# Patient Record
Sex: Male | Born: 1990 | Race: Black or African American | Hispanic: No | Marital: Single | Smoking: Current every day smoker
Health system: Southern US, Community
[De-identification: ages and names within clinical notes are randomized; demographics above are authoritative.]

## PROBLEM LIST (undated history)

## (undated) DIAGNOSIS — F329 Major depressive disorder, single episode, unspecified: Secondary | ICD-10-CM

## (undated) DIAGNOSIS — F203 Undifferentiated schizophrenia: Secondary | ICD-10-CM

## (undated) DIAGNOSIS — F32A Depression, unspecified: Secondary | ICD-10-CM

## (undated) DIAGNOSIS — F319 Bipolar disorder, unspecified: Secondary | ICD-10-CM

## (undated) HISTORY — PX: FRACTURE SURGERY: SHX138

---
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
## 2015-05-23 ENCOUNTER — Emergency Department (HOSPITAL_COMMUNITY)
Admission: EM | Admit: 2015-05-23 | Discharge: 2015-05-24 | Disposition: A | Discharge status: Other Acute Inpatient Hospital | Payer: Self-pay | Attending: Emergency Medicine | Admitting: Emergency Medicine

## 2015-05-23 ENCOUNTER — Emergency Department (HOSPITAL_COMMUNITY): Payer: Self-pay

## 2015-05-23 ENCOUNTER — Encounter (HOSPITAL_COMMUNITY): Payer: Self-pay | Admitting: Oncology

## 2015-05-23 DIAGNOSIS — X781XXA Intentional self-harm by knife, initial encounter: Secondary | ICD-10-CM | POA: Insufficient documentation

## 2015-05-23 DIAGNOSIS — Y998 Other external cause status: Secondary | ICD-10-CM | POA: Insufficient documentation

## 2015-05-23 DIAGNOSIS — Z8659 Personal history of other mental and behavioral disorders: Secondary | ICD-10-CM | POA: Insufficient documentation

## 2015-05-23 DIAGNOSIS — S51812A Laceration without foreign body of left forearm, initial encounter: Secondary | ICD-10-CM | POA: Insufficient documentation

## 2015-05-23 DIAGNOSIS — M79641 Pain in right hand: Secondary | ICD-10-CM

## 2015-05-23 DIAGNOSIS — Z23 Encounter for immunization: Secondary | ICD-10-CM | POA: Insufficient documentation

## 2015-05-23 DIAGNOSIS — Y9289 Other specified places as the place of occurrence of the external cause: Secondary | ICD-10-CM | POA: Insufficient documentation

## 2015-05-23 DIAGNOSIS — F17200 Nicotine dependence, unspecified, uncomplicated: Secondary | ICD-10-CM | POA: Insufficient documentation

## 2015-05-23 DIAGNOSIS — Z880 Allergy status to penicillin: Secondary | ICD-10-CM | POA: Insufficient documentation

## 2015-05-23 DIAGNOSIS — R45851 Suicidal ideations: Secondary | ICD-10-CM

## 2015-05-23 DIAGNOSIS — F329 Major depressive disorder, single episode, unspecified: Secondary | ICD-10-CM

## 2015-05-23 DIAGNOSIS — Y9389 Activity, other specified: Secondary | ICD-10-CM | POA: Insufficient documentation

## 2015-05-23 HISTORY — DX: Bipolar disorder, unspecified: F31.9

## 2015-05-23 HISTORY — DX: Undifferentiated schizophrenia: F20.3

## 2015-05-23 HISTORY — DX: Depression, unspecified: F32.A

## 2015-05-23 HISTORY — DX: Major depressive disorder, single episode, unspecified: F32.9

## 2015-05-23 LAB — COMPREHENSIVE METABOLIC PANEL
ALBUMIN: 3.8 g/dL (ref 3.5–5.0)
ALK PHOS: 49 U/L (ref 38–126)
ALT: 36 U/L (ref 17–63)
ANION GAP: 7 (ref 5–15)
AST: 27 U/L (ref 15–41)
BUN: 12 mg/dL (ref 6–20)
CALCIUM: 9.3 mg/dL (ref 8.9–10.3)
CO2: 28 mmol/L (ref 22–32)
Chloride: 107 mmol/L (ref 101–111)
Creatinine, Ser: 1.07 mg/dL (ref 0.61–1.24)
GFR calc Af Amer: >60 mL/min (ref >60)
GFR calc non Af Amer: >60 mL/min (ref >60)
GLUCOSE: 112 mg/dL — AB (ref 65–99)
Potassium: 3.4 mmol/L — ABNORMAL LOW (ref 3.5–5.1)
Sodium: 142 mmol/L (ref 135–145)
Total Bilirubin: 0.7 mg/dL (ref 0.3–1.2)
Total Protein: 6.8 g/dL (ref 6.5–8.1)

## 2015-05-23 LAB — SALICYLATE LEVEL: Salicylate Lvl: <4 mg/dL (ref 2.8–30.0)

## 2015-05-23 LAB — ETHANOL: Alcohol, Ethyl (B): <5 mg/dL (ref <5)

## 2015-05-23 LAB — CBC
HEMATOCRIT: 37.3 % — AB (ref 39.0–52.0)
HEMOGLOBIN: 13.3 g/dL (ref 13.0–17.0)
MCH: 29.7 pg (ref 26.0–34.0)
MCHC: 35.7 g/dL (ref 30.0–36.0)
MCV: 83.3 fL (ref 78.0–100.0)
PLATELETS: 294 10*3/uL (ref 150–400)
RBC: 4.48 MIL/uL (ref 4.22–5.81)
RDW: 12.7 % (ref 11.5–15.5)
WBC: 13.6 10*3/uL — ABNORMAL HIGH (ref 4.0–10.5)

## 2015-05-23 LAB — RAPID URINE DRUG SCREEN, HOSP PERFORMED
Amphetamines: NOT DETECTED
BARBITURATES: NOT DETECTED
Benzodiazepines: NOT DETECTED
COCAINE: NOT DETECTED
Opiates: NOT DETECTED
Tetrahydrocannabinol: NOT DETECTED

## 2015-05-23 LAB — ACETAMINOPHEN LEVEL

## 2015-05-23 MED ORDER — LORAZEPAM 1 MG PO TABS
1.0000 mg | ORAL_TABLET | Freq: Three times a day (TID) | ORAL | Status: DC | PRN
  Administered 2015-05-23 (×2): 1 mg via ORAL
  Filled 2015-05-23 (×2): qty 1

## 2015-05-23 MED ORDER — TETANUS-DIPHTH-ACELL PERTUSSIS 5-2.5-18.5 LF-MCG/0.5 IM SUSP
0.5000 mL | Freq: Once | INTRAMUSCULAR | Status: AC
  Administered 2015-05-23: 0.5 mL via INTRAMUSCULAR
  Filled 2015-05-23: qty 0.5

## 2015-05-23 MED ORDER — NICOTINE 21 MG/24HR TD PT24
21.0000 mg | MEDICATED_PATCH | Freq: Every day | TRANSDERMAL | Status: DC
  Filled 2015-05-23: qty 1

## 2015-05-23 MED ORDER — ACETAMINOPHEN 325 MG PO TABS
650.0000 mg | ORAL_TABLET | ORAL | Status: DC | PRN
  Administered 2015-05-23: 650 mg via ORAL
  Filled 2015-05-23: qty 2

## 2015-05-23 MED ORDER — LIDOCAINE HCL (PF) 1 % IJ SOLN
5.0000 mL | Freq: Once | INTRAMUSCULAR | Status: AC
  Administered 2015-05-23: 5 mL
  Filled 2015-05-23: qty 5

## 2015-05-23 MED ORDER — ONDANSETRON HCL 4 MG PO TABS: 4.0000 mg | ORAL_TABLET | Freq: Three times a day (TID) | ORAL | Status: DC | PRN

## 2015-05-23 MED ORDER — IBUPROFEN 200 MG PO TABS
600.0000 mg | ORAL_TABLET | Freq: Three times a day (TID) | ORAL | Status: DC | PRN
  Administered 2015-05-23 (×2): 600 mg via ORAL
  Filled 2015-05-23 (×2): qty 3

## 2015-05-23 MED ORDER — POTASSIUM CHLORIDE CRYS ER 20 MEQ PO TBCR
40.0000 meq | EXTENDED_RELEASE_TABLET | Freq: Once | ORAL | Status: AC
  Administered 2015-05-23: 40 meq via ORAL
  Filled 2015-05-23: qty 2

## 2015-05-23 MED ORDER — ALUM & MAG HYDROXIDE-SIMETH 200-200-20 MG/5ML PO SUSP: 30.0000 mL | ORAL | Status: DC | PRN

## 2015-05-23 NOTE — BH Assessment (Addendum)
Assessment Note  Henry Mathis is an 25 y.o. male presenting to [facility] for lacerations to his arm. Patient states that he cut himself last night due to an argument with his girlfriend and "I have another baby and that baby is not hers." Patient denies SI with no intent or plan. Patient states that he has had an unknown amount of suicide attempts in the past with the last one being in 2013. Patient reports that he has a history of cutting himself to "relieve stress" but it has "been a while" before last night. Patient denies HI with no intent or plan. Patient denies telling the EDP that he wanted to kill someone stating that he said "she asked if i ever thought about hurting people, and I do when I'm mad, but i wouldn't kill anyone."  Patient denies history of abuse towards others. Patient denies AVH and does not appear to be responding to internal stimuli. Patient contracts for safety and reports "I can go home, but honestly, I want some medication to keep me mellow, so I ain't got to smoke weed or nothing, that just keeps me calm." Patient then reports that even if he was prescribed medication "I don't got medicaid or nothing so I don't know how I can take it." Patient was directed to [facility] and reports "I don't want to go there." Patient denies use of drugs or alcohol and no UDS or BAL is clear at this time.    Patient was alert and oriented and calm and cooperative. Patient was under a blanket in a triage room and was assessed alone. Patient denies history of trauma/abuse. Patient denies history of aggression. Patient denies access to firearms/weapons. Patient received 2 in one laceration and 2 in the other for a total of 4 stitches due to the lacerations in his arm.   Consulted with Nanine Means, PMH-NP who recommends treatment in the observation unit at this time.  Diagnosis: Bipolar Disorder  Past Medical History:  Past Medical History  Diagnosis Date  . Schizophrenia, acute undifferentiated  (HCC)   . Bipolar 1 disorder (HCC)   . Depression     Past Surgical History  Procedure Laterality Date  . Fracture surgery      left hand    Family History: No family history on file.  Social History:  reports that he has been smoking.  He has never used smokeless tobacco. He reports that he drinks alcohol. He reports that he does not use illicit drugs.  Additional Social History:  Alcohol / Drug Use Pain Medications: See PTA Prescriptions: See PTA Over the Counter: See PTA History of alcohol / drug use?: No history of alcohol / drug abuse  CIWA: CIWA-Ar BP: 109/65 mmHg Pulse Rate: 63 COWS:    Allergies:  Allergies  Allergen Reactions  . Penicillins Other (See Comments)    He does not know    Home Medications:  (Not in a hospital admission)  OB/GYN Status:  No LMP for male patient.  General Assessment Data Location of Assessment: [facility] TTS Assessment: In system Is this a Tele or Face-to-Face Assessment?: Face-to-Face Is this an Initial Assessment or a Re-assessment for this encounter?: Initial Assessment Marital status: Single Is patient pregnant?: No Pregnancy Status: No Living Arrangements: Spouse/significant other, Children, Parent Can pt return to current living arrangement?: Yes Admission Status: Voluntary Is patient capable of signing voluntary admission?: Yes Referral Source: Self/Family/Friend     Crisis Care Plan Living Arrangements: Spouse/significant other, Children, Parent Name of  Psychiatrist: None Name of Therapist: None  Education Status Is patient currently in school?: No Highest grade of school patient has completed: 12th  Risk to self with the past 6 months Suicidal Ideation: No Has patient been a risk to self within the past 6 months prior to admission? : Yes Suicidal Intent: No Has patient had any suicidal intent within the past 6 months prior to admission? : Yes Is patient at risk for suicide?: No Suicidal Plan?: No Has  patient had any suicidal plan within the past 6 months prior to admission? : Other (comment) ("I was going to light myself on fire and I put oil on me") Access to Means: No What has been your use of drugs/alcohol within the last 12 months?: Denies Previous Attempts/Gestures: Yes How many times?Marland Kitchen  Otho Bellows) Other Self Harm Risks: Cutting Triggers for Past Attempts: Other (Comment) ("stress") Intentional Self Injurious Behavior: Cutting Comment - Self Injurious Behavior: cutting - to relieve stress (last time last night) Family Suicide History: No Recent stressful life event(s): Other (Comment) ("my baby") Persecutory voices/beliefs?: No Depression: Yes Depression Symptoms: Insomnia, Loss of interest in usual pleasures Substance abuse history and/or treatment for substance abuse?: No Suicide prevention information given to non-admitted patients: Not applicable  Risk to Others within the past 6 months Homicidal Ideation: No Does patient have any lifetime risk of violence toward others beyond the six months prior to admission? : No Thoughts of Harm to Others: No Current Homicidal Intent: No Current Homicidal Plan: No Access to Homicidal Means: No Identified Victim: Denies History of harm to others?: No Assessment of Violence: None Noted Violent Behavior Description: Denies Does patient have access to weapons?: No Criminal Charges Pending?: Yes Describe Pending Criminal Charges: Assault Does patient have a court date: Yes Court Date:  Otho Bellows) Is patient on probation?: Yes  Psychosis Hallucinations: None noted Delusions: None noted  Mental Status Report Appearance/Hygiene: In scrubs Eye Contact: Poor Motor Activity: Unable to assess Speech: Logical/coherent Level of Consciousness: Alert Mood: Pleasant Affect: Appropriate to circumstance Anxiety Level: None Thought Processes: Coherent, Relevant Judgement: Unimpaired Orientation: Person, Place, Time, Situation, Appropriate for  developmental age Obsessive Compulsive Thoughts/Behaviors: None  Cognitive Functioning Concentration: Normal Memory: Recent Intact, Remote Intact IQ: Average Insight: Fair Impulse Control: Fair Appetite: Fair Sleep: Decreased Total Hours of Sleep: 5 Vegetative Symptoms: None  ADLScreening [facility] Assessment Services) Patient's cognitive ability adequate to safely complete daily activities?: Yes Patient able to express need for assistance with ADLs?: Yes Independently performs ADLs?: Yes (appropriate for developmental age)  Prior Inpatient Therapy Prior Inpatient Therapy: Yes Prior Therapy Dates: 2013 Prior Therapy Facilty/Provider(s): [facility] Reason for Treatment: SI  Prior Outpatient Therapy Prior Outpatient Therapy: Yes Prior Therapy Dates: 2013 Prior Therapy Facilty/Provider(s): [facility] Reason for Treatment: Depression Does patient have an ACCT team?: No Does patient have Intensive In-House Services?  : No Does patient have [facility] services? : No Does patient have P4CC services?: No  ADL Screening (condition at time of admission) Patient's cognitive ability adequate to safely complete daily activities?: Yes Is the patient deaf or have difficulty hearing?: No Does the patient have difficulty seeing, even when wearing glasses/contacts?: No Does the patient have difficulty concentrating, remembering, or making decisions?: No Patient able to express need for assistance with ADLs?: Yes Does the patient have difficulty dressing or bathing?: No Independently performs ADLs?: Yes (appropriate for developmental age) Does the patient have difficulty walking or climbing stairs?: No Weakness of Legs: None Weakness of Arms/Hands: None  Home Assistive  Devices/Equipment Home Assistive Devices/Equipment: None    Abuse/Neglect Assessment (Assessment to be complete while patient is alone) Physical Abuse: Denies Verbal Abuse: Denies Sexual Abuse: Denies Exploitation of  patient/patient's resources: Denies Self-Neglect: Denies Values / Beliefs Cultural Requests During Hospitalization: None Spiritual Requests During Hospitalization: None Consults Spiritual Care Consult Needed: No Social Work Consult Needed: No Merchant navy officerAdvance Directives (For Healthcare) Does patient have an advance directive?: No Would patient like information on creating an advanced directive?: No - patient declined information    Additional Information 1:1 In Past 12 Months?: No CIRT Risk: No Elopement Risk: No Does patient have medical clearance?: No     Disposition:  Disposition Initial Assessment Completed for this Encounter: Yes  On Site Evaluation by:   Reviewed with Physician:    Deniss Wormley 05/23/2015 7:13 AM

## 2015-05-23 NOTE — Progress Notes (Signed)
Disposition CSW completed patient referrals to the following inpatient psych facilities:  [facility]  CSW will continue to follow patient for placement needs.  Seward SpeckLeo Weda Baumgarner [facility] LLCCSW,LCAS Behavioral Health Disposition CSW [phone]

## 2015-05-23 NOTE — ED Notes (Signed)
Pt oriented to room and unit.  Pt contracts for safety.  Pt is somewhat withdrawn but will answer questions when asked.  Denies HI and AVH.  Bandage noted to left forearm is dry and intact.  15 minute checks and video monitoring in place.

## 2015-05-23 NOTE — ED Notes (Signed)
Pt brought in by EMS d/t suicide attempt.  Pt has 4 superficial lacerations to left forearm that EMS covered with gauze.  Pt endorsed suicidal intent w the lacerations to his arm.

## 2015-05-23 NOTE — ED Provider Notes (Signed)
CSN: [id number]     Arrival date & time 05/23/15  0504 History   First MD Initiated Contact with Patient 05/23/15 0559     Chief Complaint  Patient presents with  . Suicidal    HPI   Henry Mathis is a 25 y.o. male with a PMH of shizophrenia, bipolar disorder, depression who presents to the ED with lacerations to his left forearm. He states he cut himself in an attempt to harm himself. He reports he got in a fight with his girlfriend prior to arrival, which precipitated his attempt to harm himself. He notes suicidal ideation several days ago as well, with no specific plan. He reports homicidal ideation, and states he has thought about hurting others. He denies visual or auditory hallucinations. He denies drug use. He reports occasional alcohol use, and states he drank one beer 2 days ago. He also reports right hand pain, and states he thinks he may have hit it on the refrigerator. He notes he is supposed to be taking medication for his psychiatric conditions, however has not done so.   Past Medical History  Diagnosis Date  . Schizophrenia, acute undifferentiated (HCC)   . Bipolar 1 disorder (HCC)   . Depression    Past Surgical History  Procedure Laterality Date  . Fracture surgery      left hand   No family history on file. Social History  Substance Use Topics  . Smoking status: Current Every Day Smoker  . Smokeless tobacco: Never Used  . Alcohol Use: Yes      Review of Systems  Constitutional: Negative for fever and chills.  Respiratory: Negative for shortness of breath.   Cardiovascular: Negative for chest pain.  Gastrointestinal: Negative for abdominal pain.  Psychiatric/Behavioral: Positive for suicidal ideas and self-injury. Negative for hallucinations.  All other systems reviewed and are negative.     Allergies  Penicillins  Home Medications   Prior to Admission medications   Medication Sig Start Date End Date Taking? Authorizing Provider  acetaminophen  (TYLENOL) 500 MG tablet Take 500 mg by mouth every 6 (six) hours as needed for mild pain.   Yes Historical Provider, MD  Aspirin-Acetaminophen-Caffeine (GOODY HEADACHE PO) Take 1 packet by mouth every 6 (six) hours as needed (for headache).   Yes Historical Provider, MD    BP 113/50 mmHg  Pulse 71  Temp(Src) 98 F (36.7 C) (Oral)  Resp 16  Ht 5\' 10"  (1.778 m)  Wt 71.668 kg  BMI 22.67 kg/m2  SpO2 99% Physical Exam  Constitutional: He is oriented to person, place, and time. He appears well-developed and well-nourished. No distress.  HENT:  Head: Normocephalic and atraumatic.  Right Ear: External ear normal.  Left Ear: External ear normal.  Nose: Nose normal.  Mouth/Throat: Oropharynx is clear and moist.  Eyes: Conjunctivae and EOM are normal. Pupils are equal, round, and reactive to light. Right eye exhibits no discharge. Left eye exhibits no discharge. No scleral icterus.  Neck: Normal range of motion. Neck supple.  Cardiovascular: Normal rate, regular rhythm, normal heart sounds and intact distal pulses.   Pulmonary/Chest: Effort normal and breath sounds normal. No respiratory distress. He has no wheezes. He has no rales.  Abdominal: Soft. Bowel sounds are normal. He exhibits no distension and no mass. There is no tenderness. There is no rebound and no guarding.  Musculoskeletal: Normal range of motion. He exhibits no edema or tenderness.  TTP to dorsal aspect of right hand over 4th and 5th MCP.  Neurological: He is alert and oriented to person, place, and time.  Skin: Skin is warm and dry. He is not diaphoretic.  4 lacerations to dorsal aspect of left forearm.  Psychiatric: He has a normal mood and affect. His behavior is normal.  Nursing note and vitals reviewed.   ED Course  .Marland KitchenLaceration Repair Date/Time: 05/23/2015 8:15 AM Performed by: Glean Hess C Authorized by: Glean Hess C Consent: Verbal consent obtained. Risks and benefits: risks, benefits and  alternatives were discussed Consent given by: patient Patient understanding: patient states understanding of the procedure being performed Patient consent: the patient's understanding of the procedure matches consent given Procedure consent: procedure consent matches procedure scheduled Relevant documents: relevant documents present and verified Site marked: the operative site was marked Required items: required blood products, implants, devices, and special equipment available Patient identity confirmed: verbally with patient Time out: Immediately prior to procedure a "time out" was called to verify the correct patient, procedure, equipment, support staff and site/side marked as required. Body area: upper extremity Location details: left lower arm Laceration length: 2 cm Foreign bodies: no foreign bodies Tendon involvement: none Nerve involvement: none Vascular damage: no Anesthesia: local infiltration Local anesthetic: lidocaine 1% without epinephrine Anesthetic total: 2 ml Preparation: Patient was prepped and draped in the usual sterile fashion. Irrigation solution: saline Irrigation method: tap Amount of cleaning: standard Skin closure: 5-0 Prolene Number of sutures: 2 Technique: simple Approximation: close Approximation difficulty: simple Dressing: 4x4 sterile gauze Patient tolerance: Patient tolerated the procedure well with no immediate complications  .Marland KitchenLaceration Repair Date/Time: 05/23/2015 8:16 AM Performed by: Mady Gemma Authorized by: Glean Hess C Consent: Verbal consent obtained. Risks and benefits: risks, benefits and alternatives were discussed Consent given by: patient Patient understanding: patient states understanding of the procedure being performed Patient consent: the patient's understanding of the procedure matches consent given Procedure consent: procedure consent matches procedure scheduled Relevant documents: relevant documents  present and verified Site marked: the operative site was marked Required items: required blood products, implants, devices, and special equipment available Patient identity confirmed: verbally with patient Time out: Immediately prior to procedure a "time out" was called to verify the correct patient, procedure, equipment, support staff and site/side marked as required. Body area: upper extremity Location details: left lower arm Laceration length: 2 cm Foreign bodies: no foreign bodies Tendon involvement: none Nerve involvement: none Vascular damage: no Anesthesia: local infiltration Local anesthetic: lidocaine 1% without epinephrine Anesthetic total: 2 ml Patient sedated: no Preparation: Patient was prepped and draped in the usual sterile fashion. Irrigation solution: saline Irrigation method: tap Amount of cleaning: standard Skin closure: 5-0 Prolene Number of sutures: 2 Technique: simple Approximation: close Approximation difficulty: simple Dressing: 4x4 sterile gauze Patient tolerance: Patient tolerated the procedure well with no immediate complications     Labs Review Labs Reviewed  COMPREHENSIVE METABOLIC PANEL - Abnormal; Notable for the following:    Potassium 3.4 (*)    Glucose, Bld 112 (*)    All other components within normal limits  ACETAMINOPHEN LEVEL - Abnormal; Notable for the following:    Acetaminophen (Tylenol), Serum <10 (*)    All other components within normal limits  CBC - Abnormal; Notable for the following:    WBC 13.6 (*)    HCT 37.3 (*)    All other components within normal limits  ETHANOL  SALICYLATE LEVEL  URINE RAPID DRUG SCREEN, HOSP PERFORMED    Imaging Review Dg Hand Complete Right  05/23/2015  CLINICAL DATA:  Patient  complains of right hand pain after becoming angry and punching a wall. Hx of right hand surgery. EXAM: RIGHT HAND - COMPLETE 3+ VIEW COMPARISON:  None FINDINGS: No evidence of fracture of the carpal or metacarpal bones.  Radiocarpal joint is intact. Phalanges are normal. No soft tissue injury. Remote fracture of the fifth metacarpal IMPRESSION: No fracture or dislocation. Electronically Signed   By: Genevive Bi M.D.   On: 05/23/2015 09:00   I have personally reviewed and evaluated these images and lab results as part of my medical decision-making.   EKG Interpretation None      MDM   Final diagnoses:  Right hand pain  Laceration of forearm, left, initial encounter  Suicidal ideation    25 year old male presents with attempt to harm himself. States he cut his forearm today prior to arrival. Notes recent suicidal ideation, though denies specific plan.   Patient is afebrile. Vital signs stable. Patient is pleasant and cooperative on my exam. Heart regular rate and rhythm. Lungs clear to auscultation bilaterally. Abdomen soft, nontender, nondistended. 4 lacerations to dorsal aspect of left forearm, hemostatic. Tenderness to palpation to dorsal aspect of right hand over fourth and fifth MCP. Full range of motion. Patient is neurovascularly intact.  CBC remarkable for leukocytosis of 13.6, hemoglobin within normal limits. CMP remarkable for potassium 3.4, patient given oral potassium in the ED. Ethanol, salicylate, acetaminophen, UDS negative. Imaging of right hand negative for acute fracture or dislocation. 2 of the lacerations on the patient's forearm were superficial, though 2 required repair. Tetanus updated. Wounds cleaned, repaired, and dressed, which the patient tolerated well.   TTS consulted. Patient is medically cleared. Spoke with behavioral health, patient to be treated in observation unit.  BP 113/50 mmHg  Pulse 71  Temp(Src) 98 F (36.7 C) (Oral)  Resp 16  Ht  (1.778 m)  Wt 71.668 kg  BMI 22.67 kg/m2  SpO2 99%     Mady Gemma, PA-C 05/23/15 1223  Devoria Albe, MD 05/26/15 0150

## 2015-05-23 NOTE — ED Notes (Signed)
[id number] Expected date:  Expected time:  Means of arrival:  Comments: TCU 32

## 2015-05-23 NOTE — BH Assessment (Addendum)
Assessment completed.  Consulted with Maryjean Mornharles Kober, PA-C who states that patients disposition is pending lab results.    Henry PokeJoVea Markeith Jue, LCSW Therapeutic Triage Specialist [facility] 05/23/2015 7:54 AM

## 2015-05-23 NOTE — ED Notes (Signed)
Jovea from [facility] Is consulting with him at this time.

## 2015-05-23 NOTE — ED Notes (Signed)
Patient appears cooperative, flat; withdrawn. Denies SI, HI, AVH. Rates anxiety 4/10, depression 6/10. States that he believes he sleeps too much recently.  Encouragement offered. Motrin given.  Q 15 safety checks continue.

## 2015-05-23 NOTE — ED Notes (Signed)
Patient is aware a urine sample is needed, having laceration repaired at this time.

## 2015-05-23 NOTE — ED Notes (Addendum)
Pt stated he got mad yesterday and used a knife to cut his left inner forearm. Pt is pleasant and cooperative. He denies SI and Hi and contracts for safety. Pt was given breakfast. He does have good eye contact. He tols the Clinical research associatewriter he was going to start Scientist, research (physical sciences)selling [employer]. Pt remains a 1;1 for safety. He did state he has sought mental health treatment in the past. Pt was given pain medication for his left arm.Pain is a 5/10. Pt was given 1mg  of ativan, 650 mg of tylenol and 600mg  of motrin. Pt was also given an ice pack to apply. (10:45am )pt stated he got into a verbal argument with someone and then took a long walk after he cut his arm with a knife. Pt admits he has cut himself in the past when he has gotten angry but not in a long time. 11:25am - Phoned EDP concerning pts elevated WBC count. Pt confided in the tech that he is feeling very stressed over his living situation. He presently lives with his oldest child's mother but is in love with the youngest child's mom. He feels overwhelmed and needs a safe place to live.  Pt stated he is trying to be there for both his children and for both of the mother's but it is not working for him. 2:30pm per Charge pt is to move to the [facility]. Phoned [facility] to move the pt back and to give report. (2:30pm ) 2:45pm Phoned [facility] for a bed assignment. Charge nurse notified there will be a delay in the transfer.

## 2015-05-24 ENCOUNTER — Observation Stay (HOSPITAL_COMMUNITY)
Admission: AD | Admit: 2015-05-24 | Discharge: 2015-05-25 | Disposition: A | Discharge status: Home / Self Care | Payer: Self-pay | Source: Intra-hospital | Attending: Psychiatry | Admitting: Psychiatry

## 2015-05-24 ENCOUNTER — Encounter (HOSPITAL_COMMUNITY): Payer: Self-pay

## 2015-05-24 DIAGNOSIS — F332 Major depressive disorder, recurrent severe without psychotic features: Secondary | ICD-10-CM | POA: Insufficient documentation

## 2015-05-24 DIAGNOSIS — Y9389 Activity, other specified: Secondary | ICD-10-CM | POA: Insufficient documentation

## 2015-05-24 DIAGNOSIS — X781XXA Intentional self-harm by knife, initial encounter: Secondary | ICD-10-CM | POA: Insufficient documentation

## 2015-05-24 DIAGNOSIS — F17200 Nicotine dependence, unspecified, uncomplicated: Secondary | ICD-10-CM | POA: Insufficient documentation

## 2015-05-24 DIAGNOSIS — F32A Depression, unspecified: Secondary | ICD-10-CM | POA: Diagnosis present

## 2015-05-24 DIAGNOSIS — F329 Major depressive disorder, single episode, unspecified: Secondary | ICD-10-CM

## 2015-05-24 DIAGNOSIS — S51812A Laceration without foreign body of left forearm, initial encounter: Secondary | ICD-10-CM | POA: Insufficient documentation

## 2015-05-24 DIAGNOSIS — F314 Bipolar disorder, current episode depressed, severe, without psychotic features: Principal | ICD-10-CM | POA: Insufficient documentation

## 2015-05-24 DIAGNOSIS — Y929 Unspecified place or not applicable: Secondary | ICD-10-CM | POA: Insufficient documentation

## 2015-05-24 DIAGNOSIS — Y999 Unspecified external cause status: Secondary | ICD-10-CM | POA: Insufficient documentation

## 2015-05-24 DIAGNOSIS — F209 Schizophrenia, unspecified: Secondary | ICD-10-CM | POA: Insufficient documentation

## 2015-05-24 MED ORDER — FLUOXETINE HCL 20 MG PO CAPS
20.0000 mg | ORAL_CAPSULE | Freq: Every day | ORAL | Status: DC
  Administered 2015-05-24: 20 mg via ORAL
  Filled 2015-05-24: qty 1

## 2015-05-24 MED ORDER — HYDROXYZINE HCL 25 MG PO TABS: 25.0000 mg | ORAL_TABLET | Freq: Four times a day (QID) | ORAL | Status: DC | PRN

## 2015-05-24 MED ORDER — FLUOXETINE HCL 20 MG PO CAPS
20.0000 mg | ORAL_CAPSULE | Freq: Every day | ORAL | Status: DC
  Administered 2015-05-25: 20 mg via ORAL
  Filled 2015-05-24: qty 1
  Filled 2015-05-24: qty 7

## 2015-05-24 MED ORDER — MAGNESIUM HYDROXIDE 400 MG/5ML PO SUSP: 30.0000 mL | Freq: Every day | ORAL | Status: DC | PRN

## 2015-05-24 MED ORDER — NICOTINE 21 MG/24HR TD PT24
21.0000 mg | MEDICATED_PATCH | Freq: Every day | TRANSDERMAL | Status: DC
  Filled 2015-05-24: qty 1

## 2015-05-24 MED ORDER — TRAZODONE HCL 50 MG PO TABS
50.0000 mg | ORAL_TABLET | Freq: Every evening | ORAL | Status: DC | PRN
  Administered 2015-05-24: 50 mg via ORAL
  Filled 2015-05-24: qty 7
  Filled 2015-05-24: qty 1
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
## 2015-05-24 NOTE — BH Assessment (Signed)
[facility] Assessment Progress Note  Per Thedore MinsMojeed Akintayo, MD, this [facility].  Berneice Heinrichina Tate, RN, [facility] Obs [facility]; pt is to be transferred after 12:00.  Pt has signed Voluntary Admission and Consent for Treatment, as well as Consent to Release Information to his mother, and signed [facility].  Pt's nurse, Rudean HittDawnaly, has been notified, and agrees to send original paperwork along with pt via [facility] [place], and to call report to [phone] or [phone].  Doylene Canninghomas Reece Fehnel, MA Triage Specialist [phone]

## 2015-05-24 NOTE — ED Notes (Signed)
[facility].  He left the unit ambulatory with [employer].  All belongings given to the driver.
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
## 2015-05-25 DIAGNOSIS — F332 Major depressive disorder, recurrent severe without psychotic features: Secondary | ICD-10-CM | POA: Insufficient documentation

## 2015-05-25 MED ORDER — TRAZODONE HCL 50 MG PO TABS: 50.0000 mg | ORAL_TABLET | Freq: Every evening | ORAL | Status: AC | PRN

## 2015-05-25 MED ORDER — FLUOXETINE HCL 20 MG PO CAPS: 20.0000 mg | ORAL_CAPSULE | Freq: Every day | ORAL | Status: AC

## 2015-05-25 NOTE — Progress Notes (Signed)
Nursing Discharge Note  Patient continues to deny any SI/HI/AVH and is being discharged home. Patient states his mother is picking him up and is urgently moving to be discharged off the unit immediately. Patient's belongings retrieved and he is signing for them as he is also signing a copy of the AVS that remains on the chart and the other is for him to keep. Patient states he understands his medication orders and followup instructions. Patient escorted to Lobby by staff member to meet his mother who is picking him up.

## 2015-05-25 NOTE — [facility] Counselor (Signed)
Pt currently is presenting no evidence of imminent risk to self or others at present. Possible discharge tomorrow if the patient continues to deny suicidal ideation. Final disposition will be completed in the AM by extender.   Ardelle ParkLatoya McNeil, MA OBS Counselor

## 2015-05-25 NOTE — Progress Notes (Signed)
Henry Mathis is on unit in bed 3.  Pt DX: MDD.  Pt states he is in good mood and is watching Wheel of Fortune on TV.  Pt has 4 open cuts to his L forearm with stiches.  No drainage noted.  Pt complains of soreness in for arm.  Pt denies that he is SI.  Pt stated he cut himself in argument with girl friend but had not intention of killing himself.  BS 208 at bedtime.  Pt will be continuously monitored for safety except during bathroom breaks.

## 2015-05-25 NOTE — Discharge Summary (Signed)
[facility] Unit Discharge Summary Note  Patient:  Henry Mathis is an 25 y.o., male MRN:  [id number] DOB:  04/14/1990 Patient phone:  [phone] (home)  Patient address:   [address] [place] [address],  Total Time spent with patient: 30 minutes  Date of Admission:  05/24/2015 Date of Discharge: 05/25/2015  Reason for Admission:  Self injurious behaviors   Principal Problem: MDD (major depressive disorder) [facility]) Discharge Diagnoses: Patient Active Problem List   Diagnosis Date Noted  . Severe episode of recurrent major depressive disorder, without psychotic features (HCC) [F33.2]   . MDD (major depressive disorder) (HCC) [F32.9] 05/24/2015  . Depressed [F32.9] 05/24/2015    Past Psychiatric History: See H & P  Past Medical History:  Past Medical History  Diagnosis Date  . Schizophrenia, acute undifferentiated (HCC)   . Bipolar 1 disorder (HCC)   . Depression     Past Surgical History  Procedure Laterality Date  . Fracture surgery      left hand   Family History: History reviewed. No pertinent family history. Social History:  History  Alcohol Use  . Yes     History  Drug Use No    Social History   Social History  . Marital Status: Single    Spouse Name: N/A  . Number of Children: N/A  . Years of Education: N/A   Social History Main Topics  . Smoking status: Current Every Day Smoker  . Smokeless tobacco: Never Used  . Alcohol Use: Yes  . Drug Use: No  . Sexual Activity: Yes    Birth Control/ Protection: None   Other Topics Concern  . None   Social History Narrative    Hospital Course:    Henry Mathis is a 25 y.o. male with a PMH of shizophrenia, bipolar disorder, depression who presents to the ED with lacerations to his left forearm. He states he cut himself in an attempt to harm himself. He reports he got in a fight with his girlfriend prior to arrival, which precipitated his attempt to harm himself. He notes suicidal ideation  several days ago as well, with no specific plan. He denies homicidal ideation. He denies visual or auditory hallucinations. He denies drug use. He reports occasional alcohol use, and states he drank one beer two days ago. 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 He reports that he was supposed to start work today. Pt denies that he has every experienced psychotic symptoms or past manic episodes. His current presentation does not suggest Bipolar Disorder or Schizophrenia. Pt's symptoms are more consistent with depression.   Patient was admitted on 05/24/2015 to the [facility] Unit for further monitoring. He did not endorse any further intent for self harm and denied that he had ever been suicidal. Patient reported that he came home late on Saturday night, which prompted his girlfriend to think that he had cheated on her. This prompted an argument where patient got frustrated to the point of cutting his arm. Patient reported that the incident might never have happened if he had gotten in touch with girlfriend earlier in the day. He did report wanting a medicine to help with depressive symptoms and was started on Prozac. The patient received two doses of Prozac without any report of adverse effects. Patient was not found to  be a danger to himself and verbalized a desire to discharge home to follow up on a new job. The patient was provided with a fourteen day prescription for medication and [facility] for medication management. Patient left [facility] in stable condition with all belongings returned to him.   Physical Findings: AIMS: Facial and Oral Movements Muscles of Facial Expression: None, normal Lips and Perioral Area: None, normal Jaw: None, normal Tongue: None,  normal,Extremity Movements Upper (arms, wrists, hands, fingers): None, normal Lower (legs, knees, ankles, toes): None, normal, Trunk Movements Neck, shoulders, hips: None, normal, Overall Severity Severity of abnormal movements (highest score from questions above): None, normal Incapacitation due to abnormal movements: None, normal Patient's awareness of abnormal movements (rate only patient's report): No Awareness, Dental Status Current problems with teeth and/or dentures?: No Does patient usually wear dentures?: No  CIWA:  CIWA-Ar Total: 0 COWS:  COWS Total Score: 0  Musculoskeletal: Strength & Muscle Tone: within normal limits Gait & Station: normal Patient leans: N/A  Psychiatric Specialty Exam: Review of Systems  Constitutional: Negative.   HENT: Negative.   Eyes: Negative.   Respiratory: Negative.   Cardiovascular: Negative.   Gastrointestinal: Negative.   Genitourinary: Negative.   Musculoskeletal: Negative.   Skin: Negative.   Neurological: Negative.   Endo/Heme/Allergies: Negative.   Psychiatric/Behavioral: Positive for depression (Stable ). Negative for suicidal ideas, hallucinations, memory loss and substance abuse. The patient is not nervous/anxious and does not have insomnia.     Blood pressure 99/50, pulse 57, temperature 98.3 F (36.8 C), temperature source Oral, resp. rate 18, height 5\' 10"  (1.778 m), weight 69.4 kg (153 lb), SpO2 100 %.Body mass index is 21.95 kg/(m^2).  General Appearance: Casual  Eye Contact::  Good  Speech:  Clear and Coherent  Volume:  Normal  Mood:  Euthymic  Affect:  Appropriate  Thought Process:  Goal Directed and Intact  Orientation:  Full (Time, Place, and Person)  Thought Content:  WDL  Suicidal Thoughts:  No  Homicidal Thoughts:  No  Memory:  Immediate;   Good Recent;   Good Remote;   Good  Judgement:  Fair  Insight:  Present  Psychomotor Activity:  Normal  Concentration:  Good  Recall:  Good  Fund of Knowledge:Good   Language: Good  Akathisia:  No  Handed:  Right  AIMS (if indicated):     Assets:  Communication Skills Desire for Improvement Housing Intimacy Leisure Time Physical Health Resilience Social Support  ADL's:  Intact  Cognition: WNL  Sleep:      Have you used any form of tobacco in the last 30 days? (Cigarettes, Smokeless Tobacco, Cigars, and/or Pipes): Yes  Has this patient used any form of tobacco in the last 30 days? (Cigarettes, Smokeless Tobacco, Cigars, and/or Pipes) Yes, Yes, A prescription for an FDA-approved tobacco cessation medication was offered at discharge and the patient refused  Blood Alcohol level:  Lab Results  Component Value Date   ETH <5 05/23/2015    Metabolic Disorder Labs:  No results found for: HGBA1C, MPG No results found for: PROLACTIN No results found for: CHOL, TRIG, HDL, CHOLHDL, VLDL, LDLCALC  Discharge destination:  Home  Is patient on multiple antipsychotic therapies at discharge:  No   Has Patient had three or more failed trials of antipsychotic monotherapy by history:  No  Recommended Plan for Multiple Antipsychotic Therapies: NA      Discharge Instructions    Discharge instructions    Complete  by:  As directed   Please follow up with Primary Care Provider in 7-10 days to have the stitches removed from your left arm. Please return to the ED sooner if signs or symptoms of infection are detected.            Medication List    TAKE these medications      Indication   acetaminophen 500 MG tablet  Commonly known as:  TYLENOL  Take 500 mg by mouth every 6 (six) hours as needed for mild pain.      FLUoxetine 20 MG capsule  Commonly known as:  PROZAC  Take 1 capsule (20 mg total) by mouth daily.   Indication:  Depression     GOODY HEADACHE PO  Take 1 packet by mouth every 6 (six) hours as needed (for headache).      traZODone 50 MG tablet  Commonly known as:  DESYREL  Take 1 tablet (50 mg total) by mouth at bedtime as needed  for sleep.   Indication:  Trouble Sleeping       Follow-up Information    Follow up with [facility]. Go in 1 day.   Why:  continuation of care.   Contact information:   [address] [place] [address] [phone]       Follow-up recommendations:   As above   Comments:   Take all your medications as prescribed by your mental healthcare provider.  Report any adverse effects and or reactions from your medicines to your outpatient provider promptly.  Patient is instructed and cautioned to not engage in alcohol and or illegal drug use while on prescription medicines.  In the event of worsening symptoms, patient is instructed to call the crisis hotline, 911 and or go to the nearest ED for appropriate evaluation and treatment of symptoms.  Follow-up with your primary care provider for your other medical issues, concerns and or health care needs.   SignedFransisca Kaufmann, NP 05/25/2015, 2:16 PM

## 2015-05-25 NOTE — Progress Notes (Signed)
Nursing Shift Assessment  Patient denies any SI/HI/AVH nor ever having had any, just that he was angry and that is why he cut himself as he and his girlfreind were arguling for several hours. Patient is alert, cooperative, pleasant, contracts for safety on the Unit. Nurse administering medications per protocol, providing emotional support, therapeutic communication, and ensuring continuous observation of patient for safety except when pt in the bathroom. Pateint remains safe on Unit.

## 2015-05-25 NOTE — Discharge Instructions (Signed)
[facility] are [facility].

## 2017-08-02 IMAGING — CR DG HAND COMPLETE 3+V*R*
3 series · 3 of 3 positions shown · non-contrast
Comparison: None

CLINICAL DATA: Patient complains of right hand pain after becoming
angry and punching a wall. Hx of right hand surgery.

EXAM:
RIGHT HAND - COMPLETE 3+ VIEW

[x hand pa right]
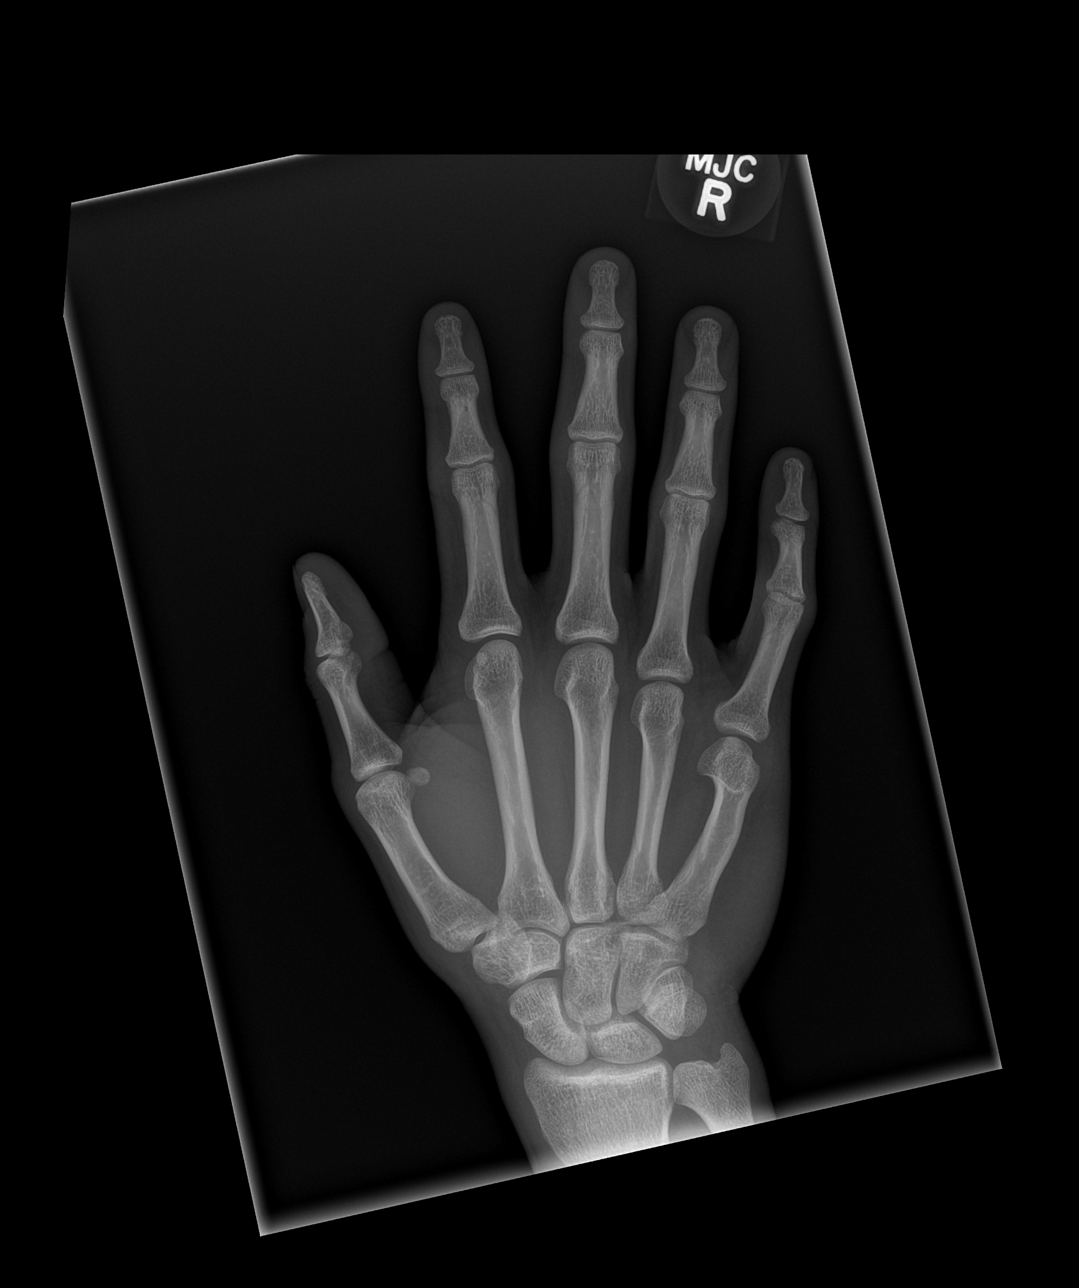

[x hand obl right]
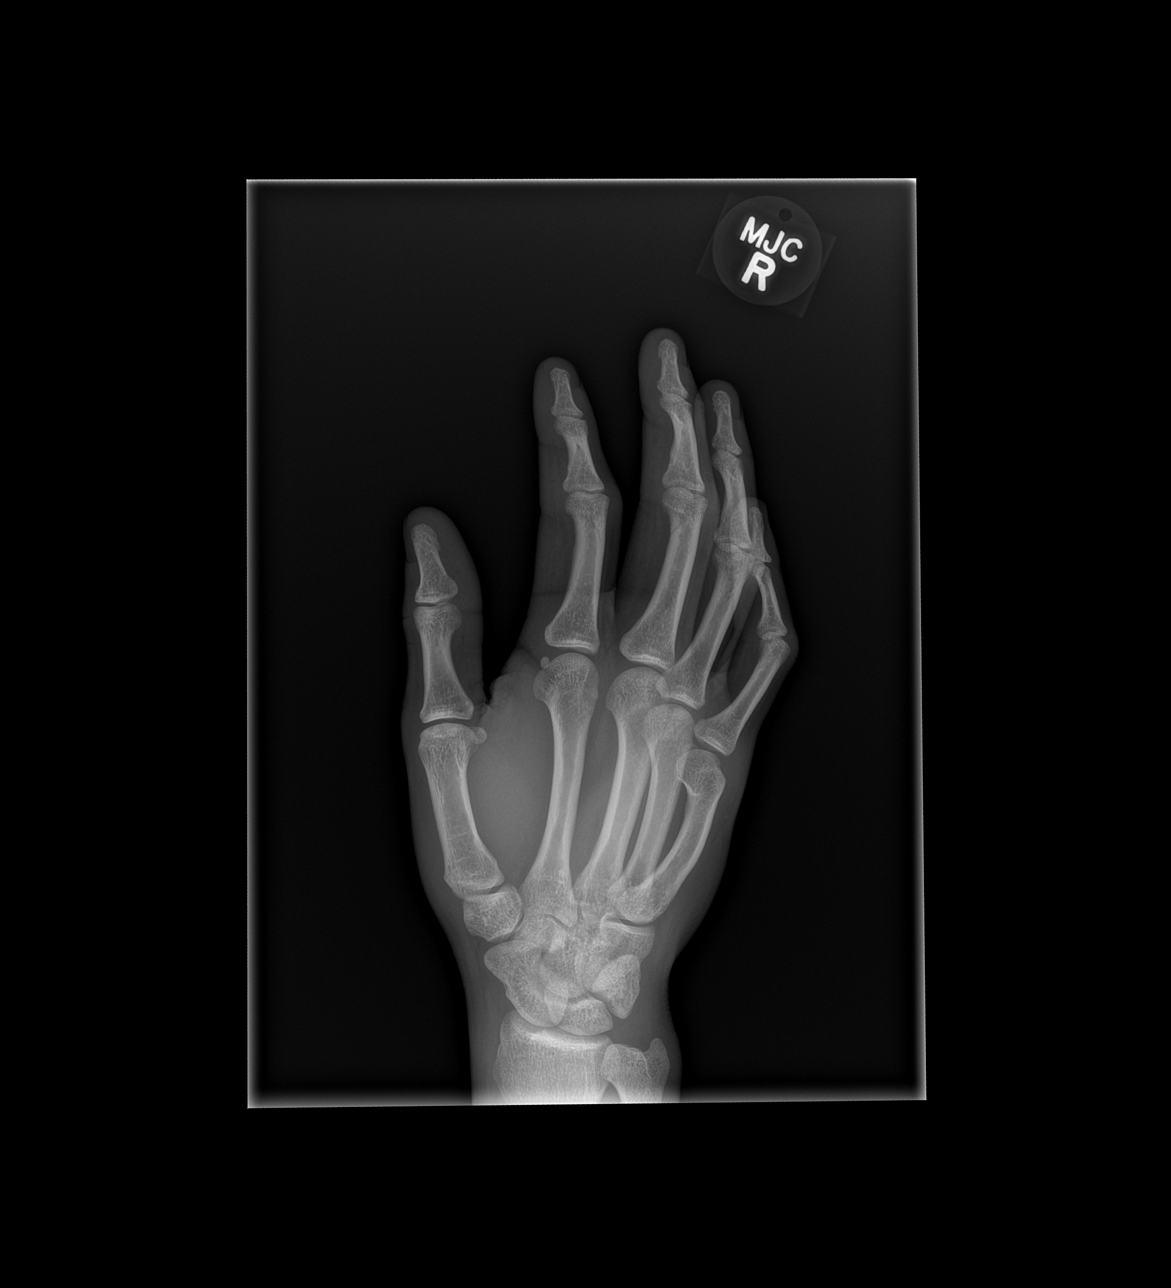

[x hand lat right]
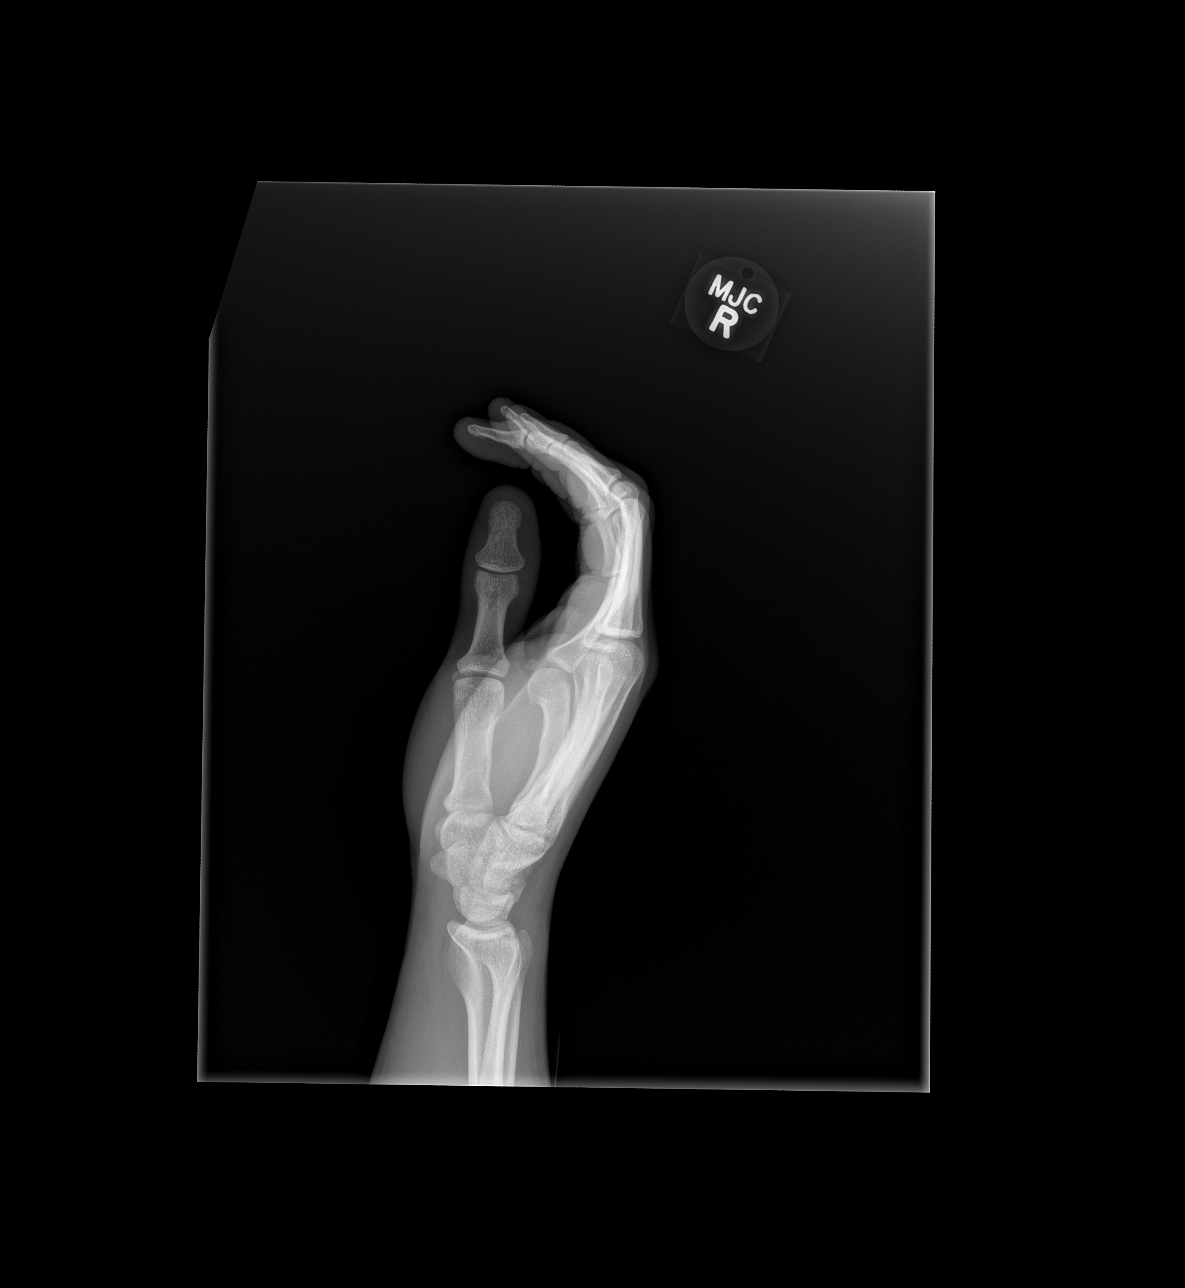

[3 of 3 positions shown; findings below may reference images not displayed]

FINDINGS: No evidence of fracture of the carpal or metacarpal bones.
Radiocarpal joint is intact. Phalanges are normal. No soft tissue
injury. Remote fracture of the fifth metacarpal
IMPRESSION: No fracture or dislocation.
